# Patient Record
Sex: Female | Born: 1995 | Race: Asian | Hispanic: No | Marital: Single
Health system: Southern US, Community
[De-identification: ages and names within clinical notes are randomized; demographics above are authoritative.]

## PROBLEM LIST (undated history)

## (undated) DIAGNOSIS — N912 Amenorrhea, unspecified: Secondary | ICD-10-CM

## (undated) DIAGNOSIS — L709 Acne, unspecified: Secondary | ICD-10-CM

---
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
## 2014-12-21 IMAGING — US US PELVIS COMPLETE
1 series · 14 of 25 positions shown · non-contrast
Comparison: None.

CLINICAL DATA: Complaining of pelvic pain. Patient has never had a
menstrual period. Questionable congenital absence of the uterus.

EXAM:
TRANSABDOMINAL ULTRASOUND OF PELVIS
TECHNIQUE: Transabdominal ultrasound examination of the pelvis was performed
including evaluation of the uterus, ovaries, adnexal regions, and
pelvic cul-de-sac.

[Series 1: us pelvis complete · 0.24mm/px · 14 of 30 slices shown]
[im 1/30]
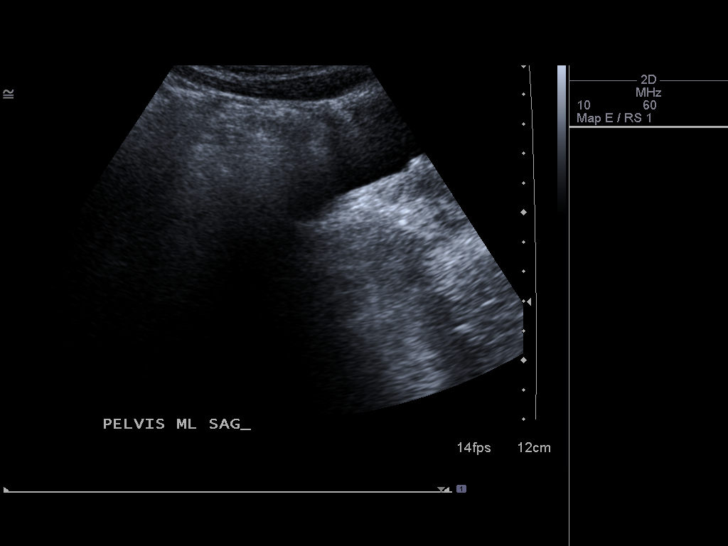
[im 3/30]
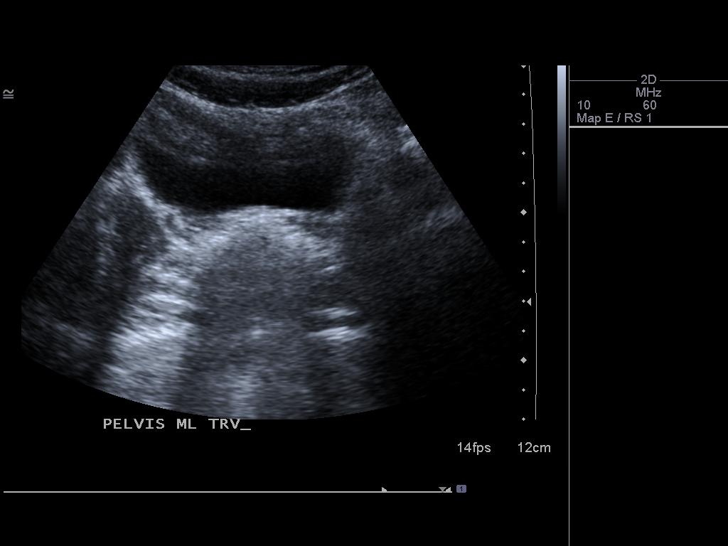
[im 5/30]
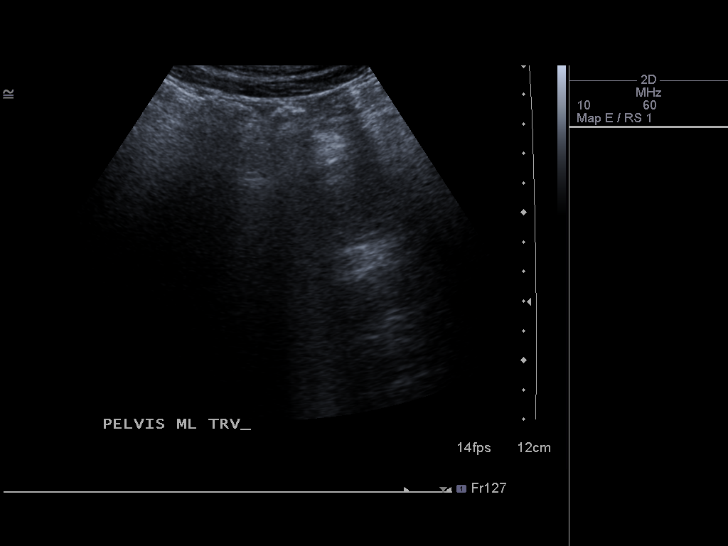
[im 8/30]
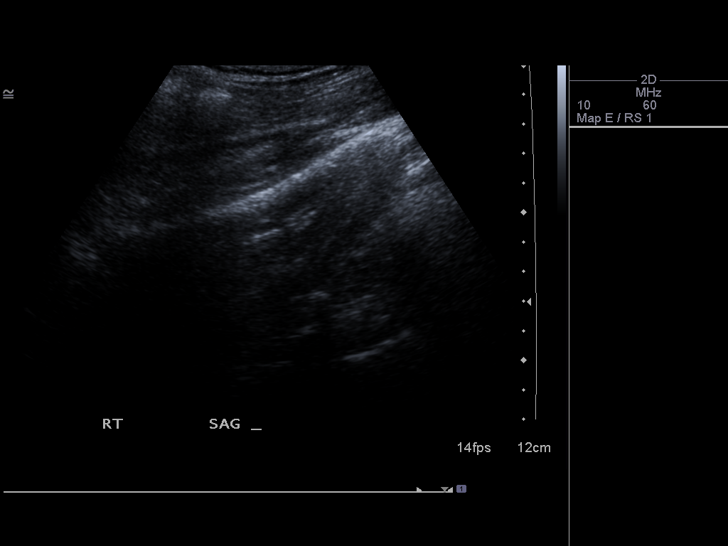
[im 10/30]
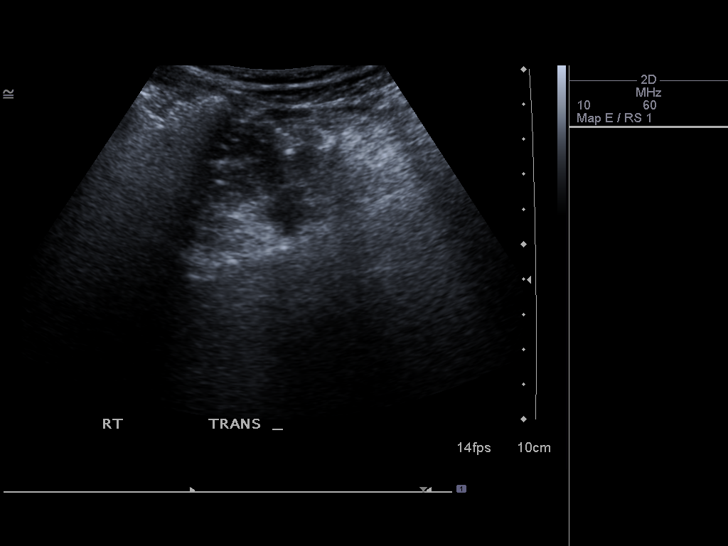
[im 11/30]
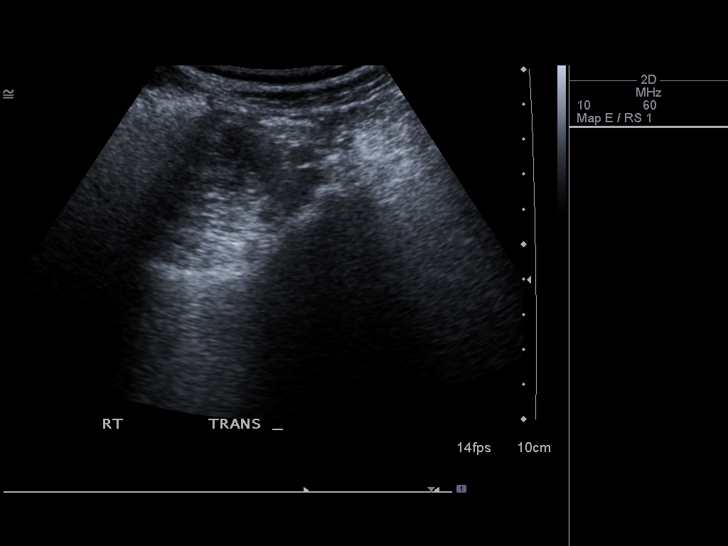
[im 14/30]
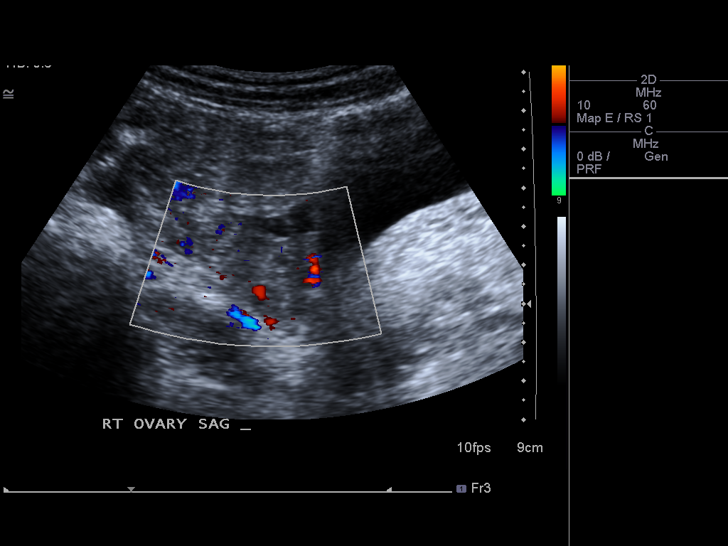
[im 16/30]
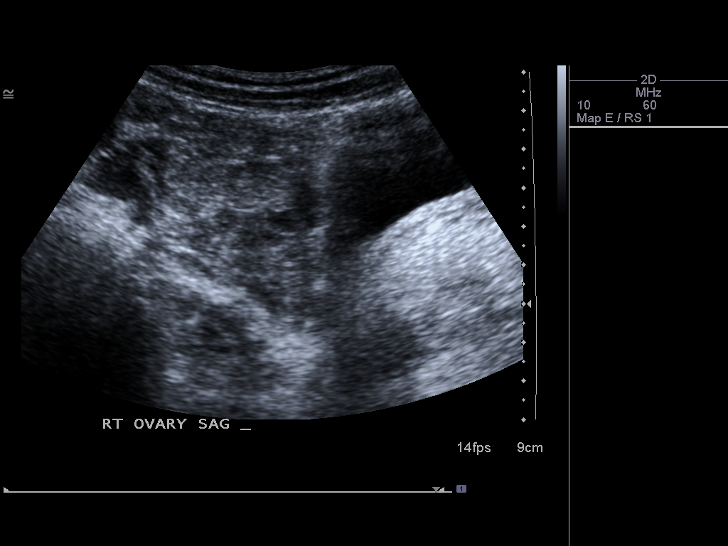
[im 19/30]
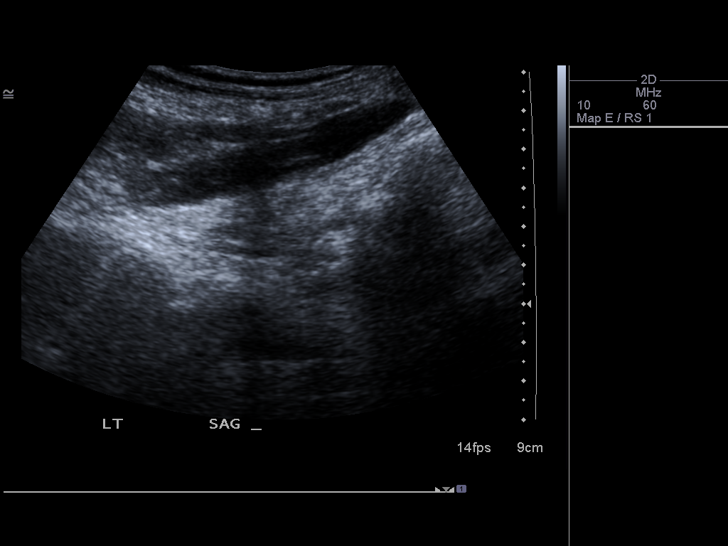
[im 20/30]
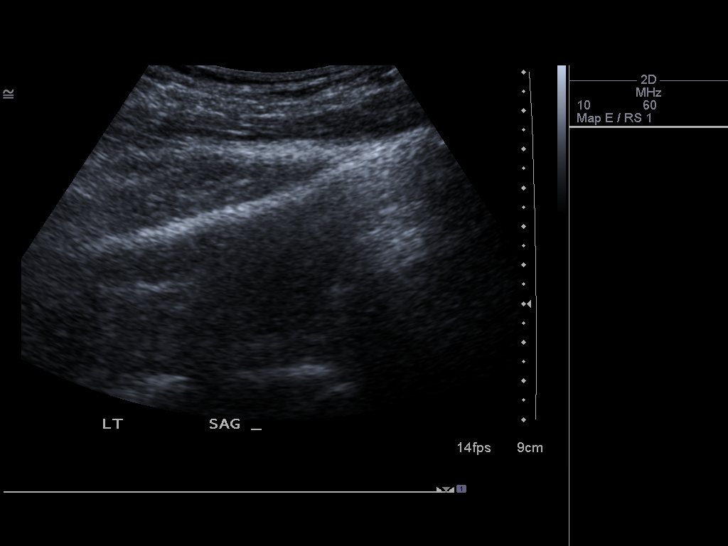
[im 22/30]
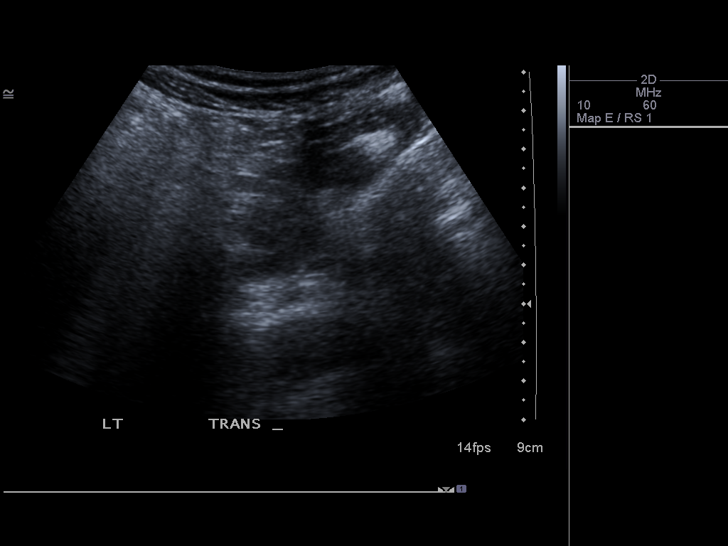
[im 25/30]
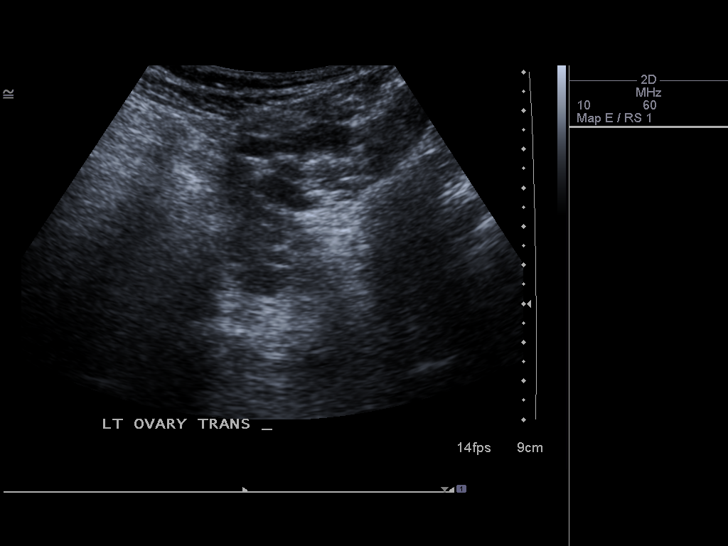
[im 27/30]
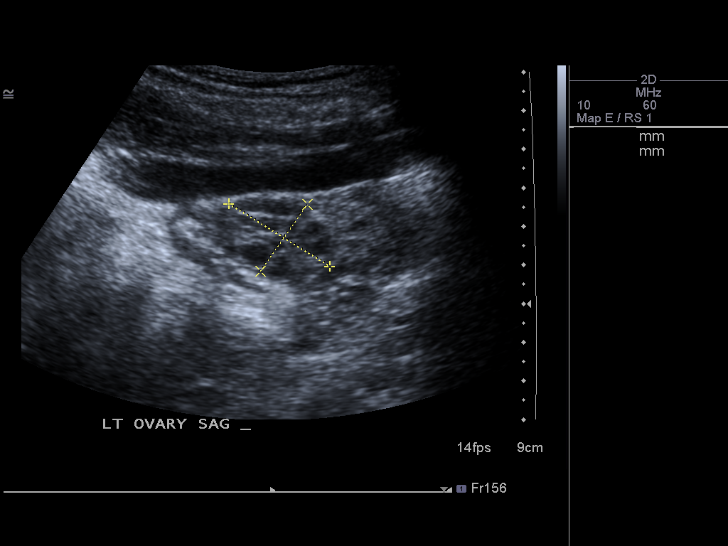
[im 30/30]
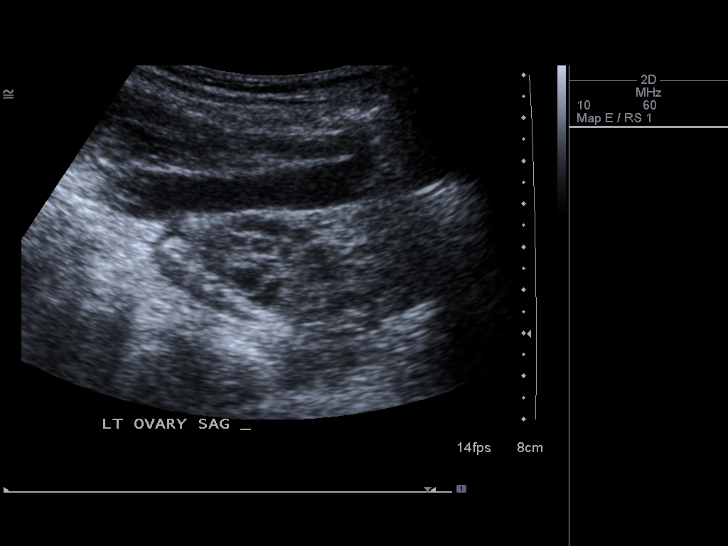

[14 of 25 positions shown; findings below may reference images not displayed]

FINDINGS: Uterus

No uterus is visualized.

Right ovary

Measurements: 30 mm x 19 mm x 21 mm. Multiple small follicular
cysts. Normal appearance/no adnexal mass.

Left ovary

Measurements: 31 mm x 21 mm x 22 mm. Multiple small follicular
cysts. Normal appearance/no adnexal mass.

Other findings:  No pelvic free fluid. No adnexal/pelvic masses.
IMPRESSION: 1. No sonographic evidence of the uterus consistent with congenital
absence. Normal ovaries.
2. This is consistent with a congenital mullerian duct abnormality.
This may reflect Kraig syndrome.
Consider evaluation of the kidneys since renal abnormalities often
coexist with mullerian duct abnormalities.

## 2015-01-28 IMAGING — US US RENAL
1 series · 14 of 25 positions shown · non-contrast
Comparison: None.

CLINICAL DATA: Uterine agenesis.  Evaluate for renal abnormality.

EXAM:
RENAL/URINARY TRACT ULTRASOUND COMPLETE

[Series 1: us renal · 14 of 50 slices shown]
[im 1/50]
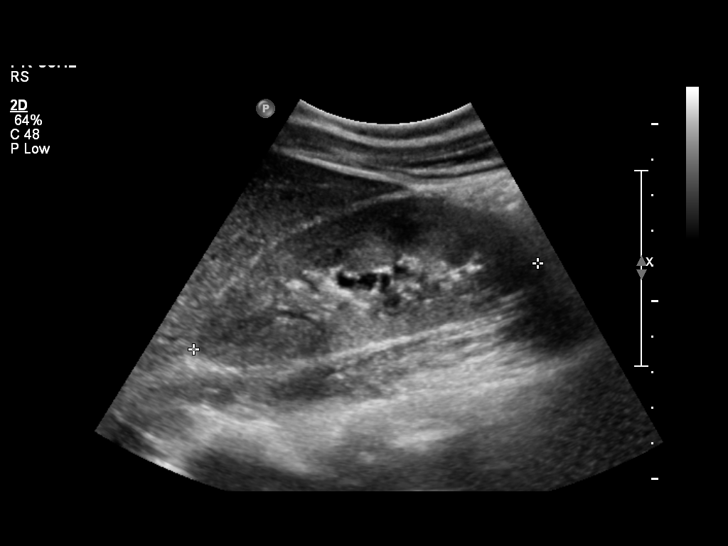
[im 5/50]
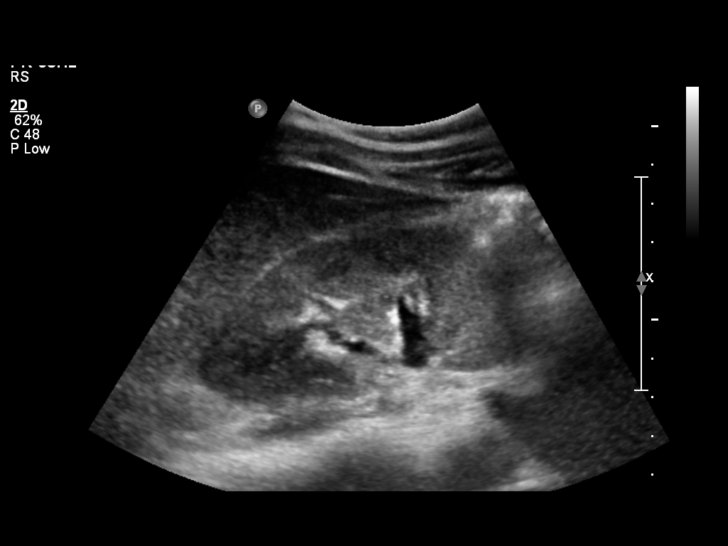
[im 9/50]
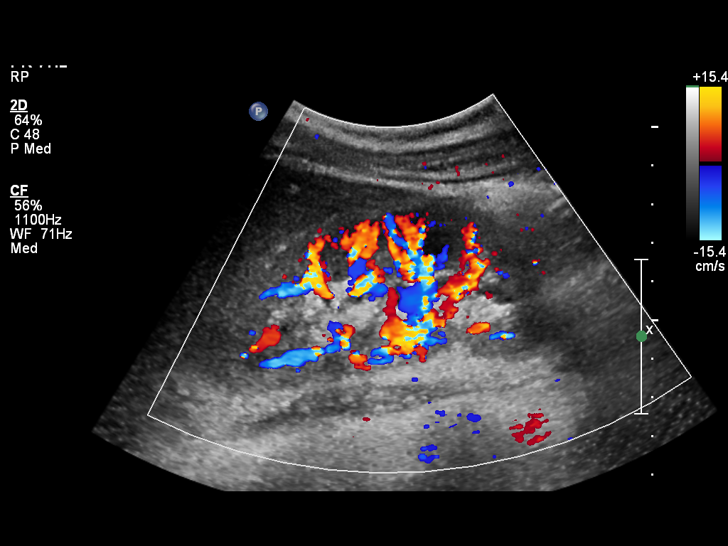
[im 13/50]
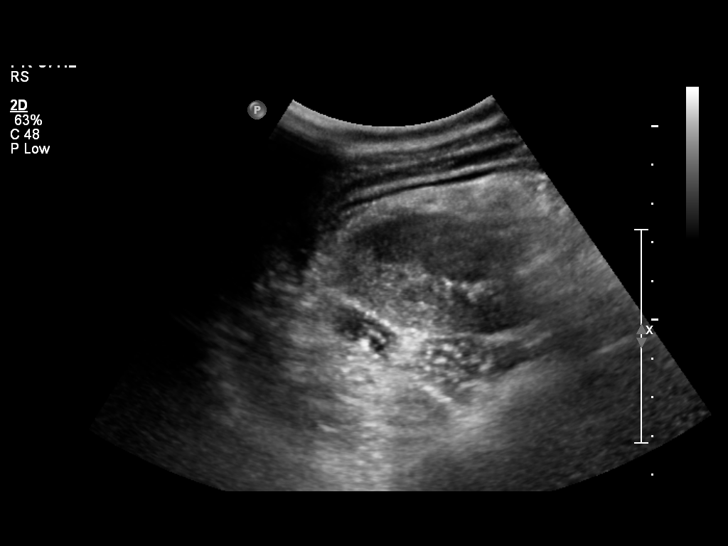
[im 17/50]
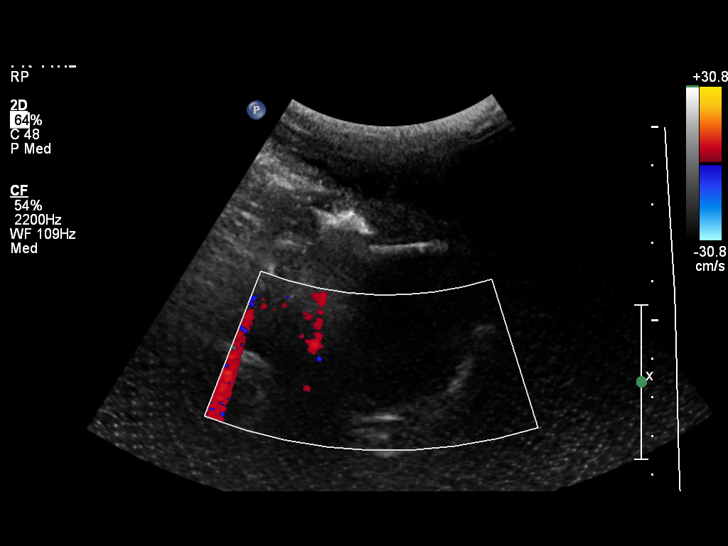
[im 19/50]
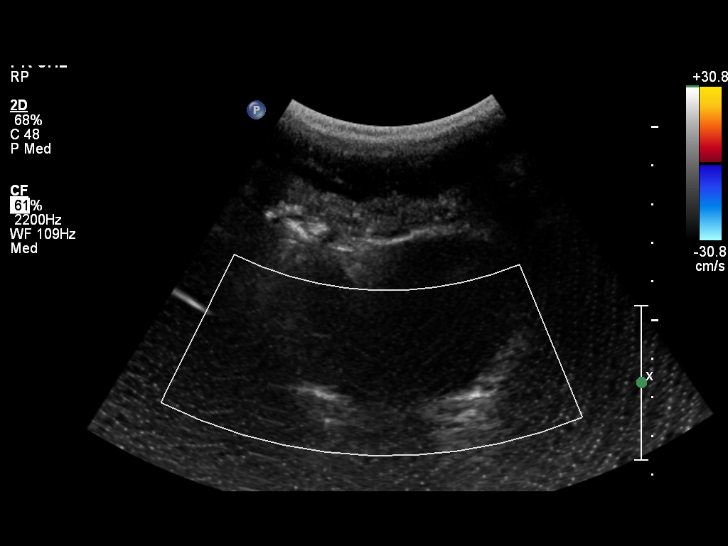
[im 23/50]
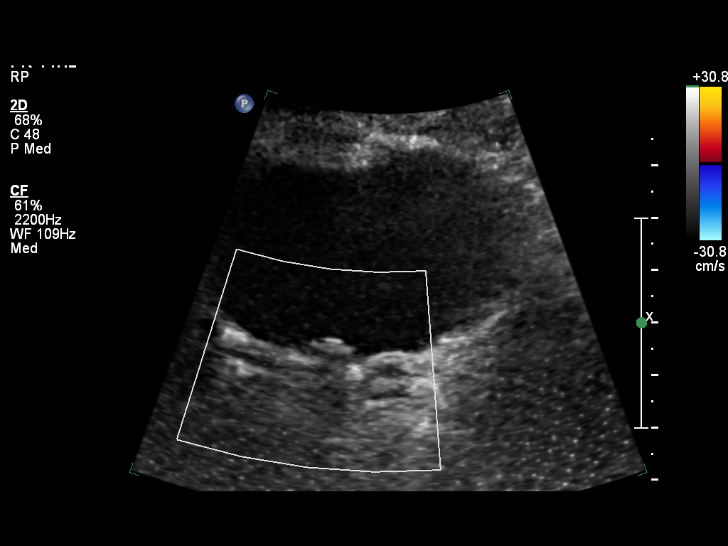
[im 27/50]
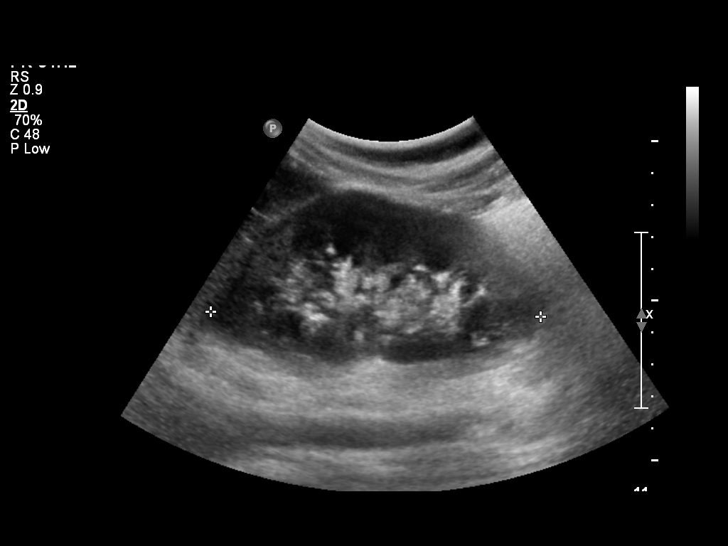
[im 31/50]
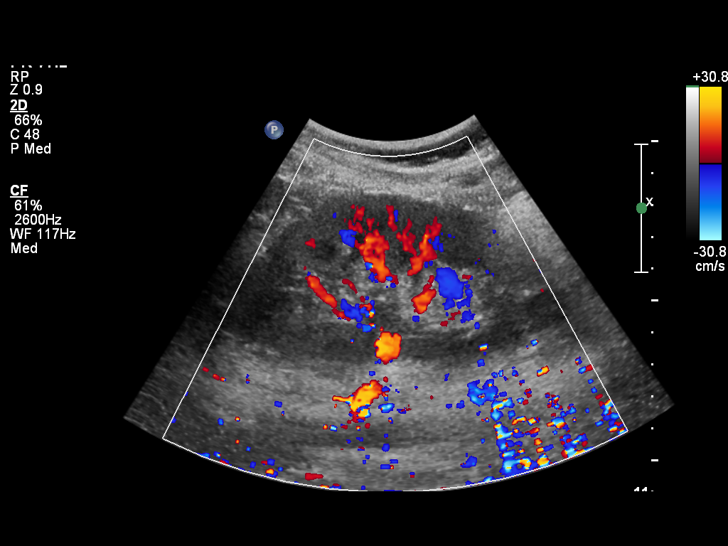
[im 33/50]
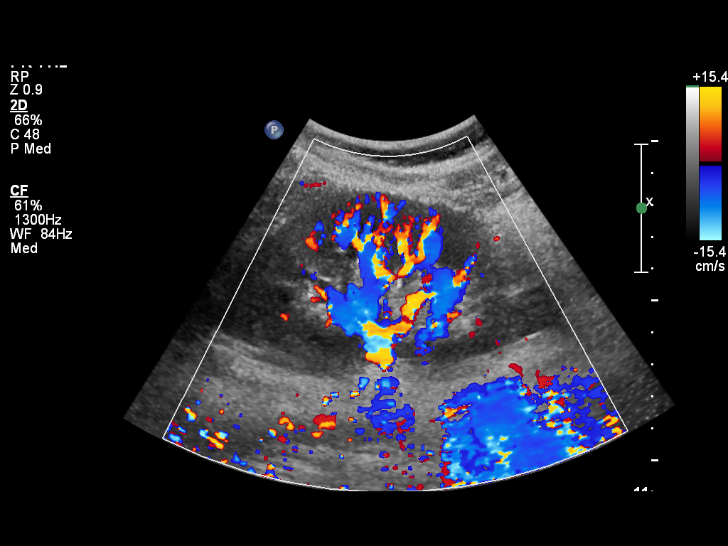
[im 37/50]
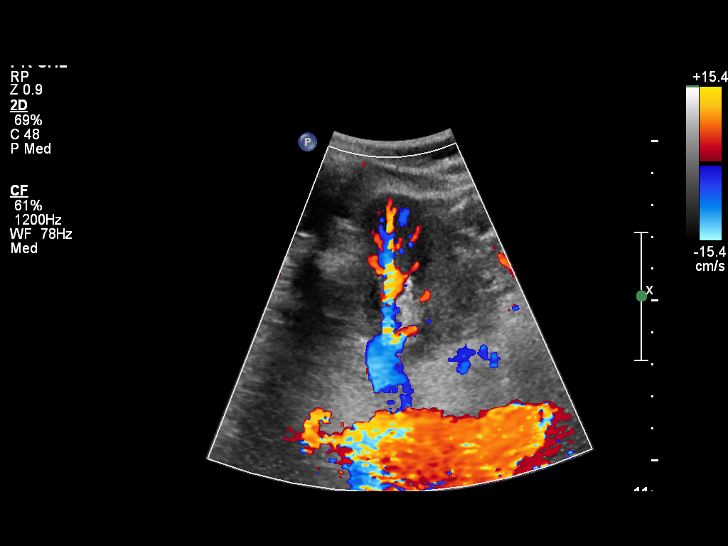
[im 41/50]
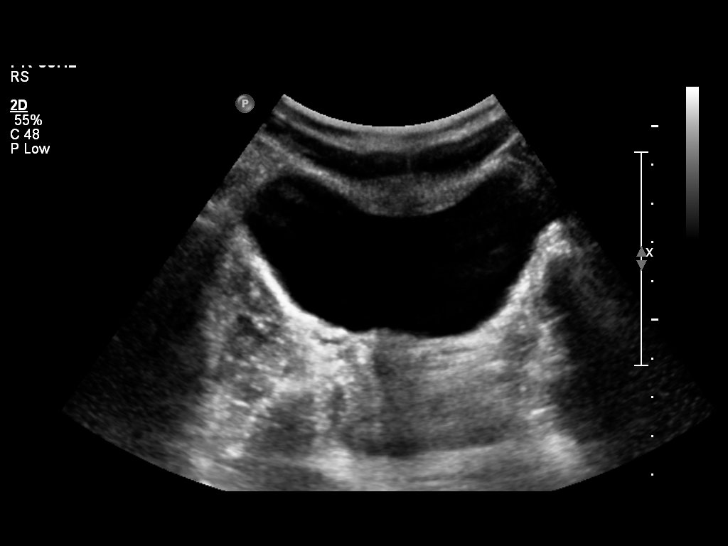
[im 45/50]
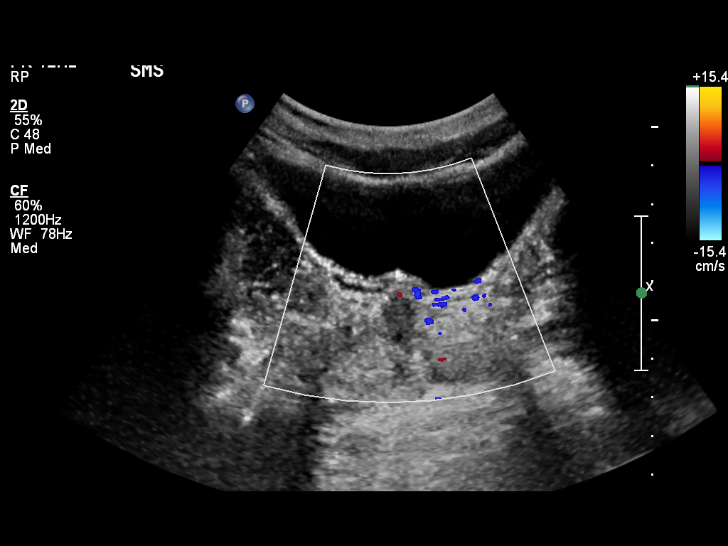
[im 50/50]
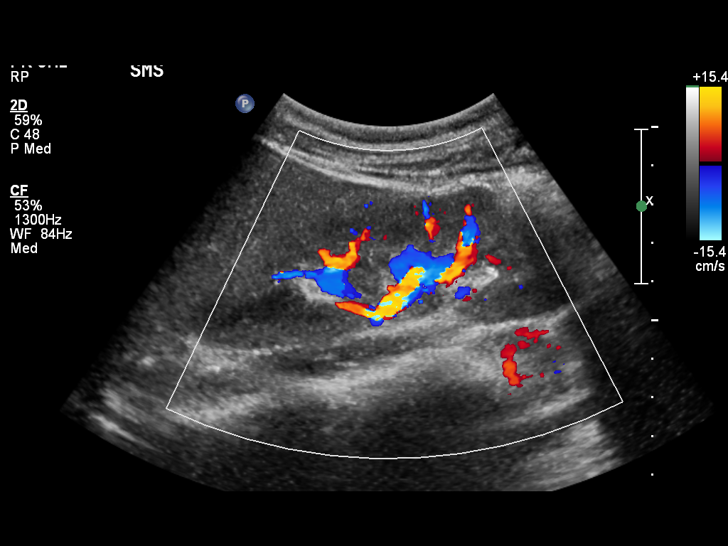

[14 of 25 positions shown; findings below may reference images not displayed]

FINDINGS: Right Kidney

Length: 10.0 cm.  No mass or hydronephrosis.

Left Kidney

Length: 10.3 cm..  No mass or hydronephrosis.

Bladder

Within normal limits.
IMPRESSION: Negative renal ultrasound.
